# Patient Record
Sex: Female | Born: 1995 | Race: White | Hispanic: No | Marital: Single | Smoking: Never smoker
Health system: Southern US, Community
[De-identification: ages and names within clinical notes are randomized; demographics above are authoritative.]

---

## 2015-10-07 ENCOUNTER — Encounter: Payer: Self-pay | Admitting: Emergency Medicine

## 2015-10-07 ENCOUNTER — Ambulatory Visit
Admission: EM | Admit: 2015-10-07 | Discharge: 2015-10-07 | Disposition: A | Discharge status: Home / Self Care | Payer: Self-pay | Attending: Family Medicine | Admitting: Family Medicine

## 2015-10-07 DIAGNOSIS — H00013 Hordeolum externum right eye, unspecified eyelid: Secondary | ICD-10-CM

## 2015-10-07 DIAGNOSIS — R21 Rash and other nonspecific skin eruption: Secondary | ICD-10-CM

## 2015-10-07 MED ORDER — POLYMYXIN B-TRIMETHOPRIM 10000-0.1 UNIT/ML-% OP SOLN: 1.0000 [drp] | Freq: Four times a day (QID) | OPHTHALMIC | Status: DC

## 2015-10-07 NOTE — ED Provider Notes (Signed)
CSN: [id number]     Arrival date & time 10/07/15  1111 History   First MD Initiated Contact with Patient 10/07/15 1133     Chief Complaint  Patient presents with  . Eye Problem  . Rash   (Consider location/radiation/quality/duration/timing/severity/associated sxs/prior Treatment) HPI: Patient presents today with symptoms of right upper eyelid swelling and redness. Patient states that the symptoms started on Monday. She also has a slightly itchy red rash on her abdomen. She denies any fever or chills. She has taken Benadryl for the rash. She does wear eye makeup but hasn't in the last few weeks. She denies any upper respiratory symptoms or fever. She denies any vision problems. She does not wear contact lenses. She has been waking up with some matting to her eyelash on the right.  History reviewed. No pertinent past medical history. History reviewed. No pertinent past surgical history. History reviewed. No pertinent family history. Social History  Substance Use Topics  . Smoking status: Never Smoker   . Smokeless tobacco: Never Used  . Alcohol Use: Yes   OB History    No data available     Review of Systems: Negative except mentioned above.  Allergies  Bee venom and Sulfa antibiotics  Home Medications   Prior to Admission medications   Medication Sig Start Date End Date Taking? Authorizing Provider  trimethoprim-polymyxin b (POLYTRIM) ophthalmic solution Place 1 drop into the right eye every 6 (six) hours. For 7 days. 10/07/15   Jolene ProvostKirtida Aireonna Bauer, MD   Meds Ordered and Administered this Visit  Medications - No data to display  BP 117/72 mmHg  Pulse 63  Temp(Src) 98 F (36.7 C) (Oral)  Resp 16  Ht 5\' 1"  (1.549 m)  Wt 120 lb (54.432 kg)  BMI 22.69 kg/m2  SpO2 100%  LMP 10/03/2015 No data found.   Physical Exam   GENERAL: NAD HEENT: no pharyngeal erythema, no exudate, no erythema of TMs, PERRL, EOMI, mild erythema and swelling of one area of right upper eyelid, there is a  small whitehead noted, mild tenderness to palpation, there is no other erythema or tenderness around the eye, no erythema of conjunctiva, no cervical LAD RESP: CTA B CARD: RRR SKIN: several small erythematous lesions on abdomen, some oval shaped, a few on back  NEURO: CN II-XII grossly intact   ED Course  Procedures (including critical care time)  Labs Review Labs Reviewed - No data to display  Imaging Review No results found.   Visual Acuity Review  Right Eye Distance: 20/25 uncorrected Left Eye Distance: 20/20 uncorrected Bilateral Distance:         MDM   1. Hordeolum, right   2. Skin rash   1)Discussed with patient to use warm compresses on the eye, will prescribe Polytrim ophthalmic given patient's insurance situation, if symptoms do persist or worsen I do recommend that she follow up with [facility]iments eye. Encourage patient not to wear any eye makeup at this time until symptoms have fully resolved. She should wash her hands frequently.  2)The skin rash looks suspicious for pityriasis. Discussed this with the patient, can use antihistamine if needed for any itchiness. If the symptoms of the rash do persist or worsen I do recommend that she follow-up here with her primary care physician.    Jolene ProvostKirtida My Rinke, MD 10/07/15 1153

## 2015-10-07 NOTE — Discharge Instructions (Signed)
Warm compresses to the eye 2 or 3 times a day, do not use any eye makeup, follow-up with eye doctor if symptoms persist or worsen as discussed.

## 2015-10-07 NOTE — ED Notes (Signed)
Patient c/o redness and swelling in her right upper eye lid on Monday.  Patient c/o itchy rash on her abdomen that started on Monday.

## 2016-04-17 ENCOUNTER — Encounter: Payer: Self-pay | Admitting: Emergency Medicine

## 2016-04-17 ENCOUNTER — Ambulatory Visit
Admission: EM | Admit: 2016-04-17 | Discharge: 2016-04-17 | Disposition: A | Discharge status: Home / Self Care | Payer: BLUE CROSS/BLUE SHIELD | Attending: Family Medicine | Admitting: Family Medicine

## 2016-04-17 ENCOUNTER — Ambulatory Visit (INDEPENDENT_AMBULATORY_CARE_PROVIDER_SITE_OTHER): Payer: BLUE CROSS/BLUE SHIELD

## 2016-04-17 DIAGNOSIS — S96911A Strain of unspecified muscle and tendon at ankle and foot level, right foot, initial encounter: Secondary | ICD-10-CM | POA: Diagnosis not present

## 2016-04-17 NOTE — ED Provider Notes (Signed)
CSN: [id number]     Arrival date & time 04/17/16  1300 History   First MD Initiated Contact with Patient 04/17/16 1400     Chief Complaint  Patient presents with  . Ankle Pain   (Consider location/radiation/quality/duration/timing/severity/associated sxs/prior Treatment) Patient is a healthy 20 year old female, presents today for right ankle pain. Patient stepped off her boyfriend's truck last night and twisted the right ankle on the way down. Patient denies ankle pain immediately after the incident and was able to ambulate. The ankle pain started this morning upon waking up. Patient reports able to ambulate but is limping. Current pain is 5 out of 10.    The history is provided by the patient.    History reviewed. No pertinent past medical history. History reviewed. No pertinent surgical history. No family history on file. Social History  Substance Use Topics  . Smoking status: Never Smoker  . Smokeless tobacco: Never Used  . Alcohol use Yes   OB History    No data available     Review of Systems  All other systems reviewed and are negative.   Allergies  Bee venom and Sulfa antibiotics  Home Medications   Prior to Admission medications   Medication Sig Start Date End Date Taking? Authorizing Provider  trimethoprim-polymyxin b (POLYTRIM) ophthalmic solution Place 1 drop into the right eye every 6 (six) hours. For 7 days. 10/07/15   Jolene ProvostKirtida Patel, MD   Meds Ordered and Administered this Visit  Medications - No data to display  BP 110/63 (BP Location: Left Arm)   Pulse 80   Temp 98.3 F (36.8 C) (Tympanic)   Resp 18   Ht 5\' 1"  (1.549 m)   Wt 130 lb (59 kg)   LMP 04/17/2016 (Exact Date)   SpO2 100%   BMI 24.56 kg/m  No data found.   Physical Exam  Constitutional: She is oriented to person, place, and time. She appears well-developed and well-nourished.  Cardiovascular: Normal rate.   Pulmonary/Chest: Effort normal.  Musculoskeletal:  Right ankle has full  range of motion. Lateral aspect of the ankle is slightly swollen but with no ecchymosis. Tender to palpate over swollen area.  Patient able to bear weight. Has intact sensation.   Neurological: She is alert and oriented to person, place, and time.  Nursing note and vitals reviewed.   Urgent Care Course   Clinical Course     Procedures (including critical care time)  Labs Review Labs Reviewed - No data to display  Imaging Review Dg Ankle Complete Right  Result Date: 04/17/2016 CLINICAL DATA:  Pt states she twisted her right ankle getting out of the truck last night, pain and swelling lateral right ankle. shielded EXAM: RIGHT ANKLE - COMPLETE 3+ VIEW COMPARISON:  None. FINDINGS: Soft tissue swelling noted along the lateral aspect of the ankle. No acute fracture or subluxation. No radiopaque foreign body or soft tissue gas. IMPRESSION: Mild soft tissue swelling. Electronically Signed   By: Norva PavlovElizabeth  Brown M.D.   On: 04/17/2016 14:18     MDM   1. Strain of right ankle, initial encounter    Ankle x-ray negative for fracture. Patient informed of the xray result. RICE therapy discussed.  F/u with PCP if ankle pain does not resolve.     Lucia EstelleFeng Willia Lampert, NP 04/17/16 1426

## 2016-04-17 NOTE — ED Triage Notes (Signed)
Kristen Poole getting out of a truck and injured right ankle on last night

## 2016-09-28 ENCOUNTER — Ambulatory Visit (INDEPENDENT_AMBULATORY_CARE_PROVIDER_SITE_OTHER): Payer: BLUE CROSS/BLUE SHIELD

## 2016-09-28 ENCOUNTER — Ambulatory Visit
Admission: EM | Admit: 2016-09-28 | Discharge: 2016-09-28 | Disposition: A | Discharge status: Home / Self Care | Payer: BLUE CROSS/BLUE SHIELD | Attending: Family Medicine | Admitting: Family Medicine

## 2016-09-28 ENCOUNTER — Encounter: Payer: Self-pay | Admitting: *Deleted

## 2016-09-28 DIAGNOSIS — M79672 Pain in left foot: Secondary | ICD-10-CM | POA: Diagnosis not present

## 2016-09-28 MED ORDER — MELOXICAM 7.5 MG PO TABS: 7.5000 mg | ORAL_TABLET | Freq: Every day | ORAL | 0 refills | Status: DC

## 2016-09-28 NOTE — ED Triage Notes (Signed)
Left foot pain for over a week, lateral side, has been on and off, now worse than before.

## 2016-09-28 NOTE — ED Provider Notes (Signed)
[facility] ____________________________________________  Time seen: Approximately 9:35 AM  I have reviewed the triage vital signs and the nursing notes.   HISTORY  Chief Complaint Foot Pain   HPI Kristen Poole is a 21 y.o. female  presenting for evaluation of left lateral foot pain that is been present for the last week. Patient reports she did have a similar pain that was present today for 1 week and then spontaneously resolved. Reports pain is in present again this past week, intermittent increases in intensity. States pain is mild currently, at times moderate. Reports did originally take some over-the-counter ibuprofen and somebody gave him last few days. Denies any new trauma, injury or recent injury. Denies any pain radiation, paresthesias or other discomfort. Reports otherwise feels well. Denies any rash, stepping on anything. denies break in skin. Reports does stand on her feet with frequent walking at work. Reports otherwise feels well. States has previously had a fracture the fifth metatarsal to this foot as well as a sprain to the ankle early this year. Denies any past surgeries the foot.  Denies chest pain, shortness of breath, abdominal pain, or rash. Denies recent sickness.   Patient's last menstrual period was 08/31/2016 (within days).Denies pregnancy.   History reviewed. No pertinent past medical history. Denies There are no active problems to display for this patient.   History reviewed. No pertinent surgical history.   No current facility-administered medications for this encounter.   Current Outpatient Prescriptions:  none Allergies Bee venom and Sulfa antibiotics  History reviewed. No pertinent family history.  Social History Social History  Substance Use Topics  . Smoking status: Never Smoker  . Smokeless tobacco: Never Used  . Alcohol use Yes    Review of Systems Constitutional: No fever/chills Cardiovascular: Denies chest  pain. Respiratory: Denies shortness of breath. Genitourinary: Negative for dysuria. Musculoskeletal: Negative for back pain. As above Skin: Negative for rash.   ____________________________________________   PHYSICAL EXAM:  VITAL SIGNS: ED Triage Vitals  Enc Vitals Group     BP 09/28/16 0836 100/61     Pulse Rate 09/28/16 0836 85     Resp 09/28/16 0836 17     Temp 09/28/16 0836 98.5 F (36.9 C)     Temp Source 09/28/16 0836 Oral     SpO2 09/28/16 0836 100 %     Weight 09/28/16 0837 125 lb (56.7 kg)     Height 09/28/16 0837 5\' 1"  (1.549 m)     Head Circumference --      Peak Flow --      Pain Score 09/28/16 0837 6     Pain Loc --      Pain Edu? --      Excl. in GC? --     Constitutional: Alert and oriented. Well appearing and in no acute distress. Cardiovascular: Normal rate, regular rhythm. Grossly normal heart sounds.  Good peripheral circulation. Respiratory: Normal respiratory effort without tachypnea nor retractions. Breath sounds are clear and equal bilaterally. No wheezes, rales, rhonchi. Musculoskeletal: No midline cervical, thoracic or lumbar tenderness to palpation. Bilateral pedal pulses equal and easily palpated. Except: Left lateral foot at the base of the fifth metatarsal mild point tenderness to palpation, no swelling, no ecchymosis, no deformity noted left foot and left lower extremity otherwise nontender, left foot with full range of motion present, normal distal sensation and capillary refill. Ambulatory with steady gait. Neurologic:  Normal speech and language.Speech is normal. No gait instability.  Skin:  Skin is warm, dry  Psychiatric: Mood and affect are normal. Speech and behavior are normal. Patient exhibits appropriate insight and judgment   ___________________________________________   LABS (all labs ordered are listed, but only abnormal results are displayed)  Labs Reviewed - No data to display  RADIOLOGY  Dg Foot Complete Left  Result  Date: 09/28/2016 CLINICAL DATA:  Pain at the base of the fifth metatarsal. Remote history injury December 2017. EXAM: LEFT FOOT - COMPLETE 3+ VIEW COMPARISON:  None. FINDINGS: There is no evidence of fracture or dislocation. There is no evidence of arthropathy or other focal bone abnormality. Soft tissues are unremarkable. IMPRESSION: Negative. Electronically Signed   By: Marnee Spring M.D.   On: 09/28/2016 09:25   ____________________________________________   PROCEDURES Procedures     INITIAL IMPRESSION / ASSESSMENT AND PLAN / ED COURSE  Pertinent labs & imaging results that were available during my care of the patient were reviewed by me and considered in my medical decision making (see chart for details).  Well-appearing patient. No acute distress. Left foot x-ray per radiologist negative. Discussed with patient encouraged monitoring shoes for good supportive shoes and avoidance irritation, rest, ice, then Mobic.encouraged supportive care. Work note given for today. Encouraged patient to follow-up with podiatry as needed for continued pain. Discussed indication, risks and benefits of medications with patient.  Discussed follow up with Primary care physician this week. Discussed follow up and return parameters including no resolution or any worsening concerns. Patient verbalized understanding and agreed to plan.   ____________________________________________   FINAL CLINICAL IMPRESSION(S) / ED DIAGNOSES  Final diagnoses:  Left foot pain     Discharge Medication List as of 09/28/2016  9:39 AM    START taking these medications   Details  meloxicam (MOBIC) 7.5 MG tablet Take 1 tablet (7.5 mg total) by mouth daily., Starting Wed 09/28/2016, Normal        Note: This dictation was prepared with Dragon dictation along with smaller phrase technology. Any transcriptional errors that result from this process are unintentional.         Renford Dills, NP 09/28/16 1000

## 2016-09-28 NOTE — Discharge Instructions (Signed)
Take medication as prescribed. Rest. Ice.  Follow up with your primary care physician or podiatry as needed. Return to Urgent care for new or worsening concerns.

## 2016-09-29 ENCOUNTER — Ambulatory Visit (INDEPENDENT_AMBULATORY_CARE_PROVIDER_SITE_OTHER): Payer: BLUE CROSS/BLUE SHIELD

## 2016-09-29 ENCOUNTER — Encounter: Payer: Self-pay | Admitting: *Deleted

## 2016-09-29 ENCOUNTER — Ambulatory Visit
Admission: EM | Admit: 2016-09-29 | Discharge: 2016-09-29 | Disposition: A | Discharge status: Home / Self Care | Payer: BLUE CROSS/BLUE SHIELD | Attending: Emergency Medicine | Admitting: Emergency Medicine

## 2016-09-29 DIAGNOSIS — Z203 Contact with and (suspected) exposure to rabies: Secondary | ICD-10-CM | POA: Diagnosis not present

## 2016-09-29 DIAGNOSIS — S51851A Open bite of right forearm, initial encounter: Secondary | ICD-10-CM

## 2016-09-29 DIAGNOSIS — W540XXA Bitten by dog, initial encounter: Secondary | ICD-10-CM

## 2016-09-29 DIAGNOSIS — Z23 Encounter for immunization: Secondary | ICD-10-CM

## 2016-09-29 MED ORDER — HYDROCODONE-ACETAMINOPHEN 5-325 MG PO TABS: 2.0000 | ORAL_TABLET | Freq: Four times a day (QID) | ORAL | 0 refills | Status: AC | PRN

## 2016-09-29 MED ORDER — TETANUS-DIPHTH-ACELL PERTUSSIS 5-2.5-18.5 LF-MCG/0.5 IM SUSP
0.5000 mL | Freq: Once | INTRAMUSCULAR | Status: AC
  Administered 2016-09-29: 0.5 mL via INTRAMUSCULAR

## 2016-09-29 MED ORDER — IBUPROFEN 600 MG PO TABS: 600.0000 mg | ORAL_TABLET | Freq: Four times a day (QID) | ORAL | 0 refills | Status: AC | PRN

## 2016-09-29 MED ORDER — AMOXICILLIN-POT CLAVULANATE 875-125 MG PO TABS: 1.0000 | ORAL_TABLET | Freq: Two times a day (BID) | ORAL | 0 refills | Status: AC

## 2016-09-29 NOTE — ED Triage Notes (Signed)
Pt bitten by neighbor's dog yesterday evening. Puncture wound to right forearm with edema. Owner states dog up to date on shots. Dog was destroyed last night.

## 2016-09-29 NOTE — ED Provider Notes (Signed)
HPI  SUBJECTIVE:  Kristen Poole is a right-handed 21 y.o. female who presents with a dog bite to her right forearm last night. Patient states that the dog was run over by a car and was stuck underneath the car, and she reached in to help the dog, and the dog bit her. States that this was her neighbor's dog. It passed away last night. All of its immunizations are up-to-date. She reports constant dull, throbbing, pain, swelling in the area. She tried rinsing the wounds with tap water and peroxide. No alleviating factors. Symptoms are worse with using her right hand. She denies foreign body sensation, erythema, joint redness, elbow pain, bodyaches, fevers, numbness or tingling. She reports grip weakness secondary to the pain. Past medical history negative for diabetes, hypertension. Last tetanus unknown. LMP: 5/16. Denies possibility of being pregnant. PMD: None. States that she does not have any allergies to penicillin.  History reviewed. No pertinent past medical history.  History reviewed. No pertinent surgical history.  History reviewed. No pertinent family history.  Social History  Substance Use Topics  . Smoking status: Never Smoker  . Smokeless tobacco: Never Used  . Alcohol use Yes    No current facility-administered medications for this encounter.   Current Outpatient Prescriptions:  .  amoxicillin-clavulanate (AUGMENTIN) 875-125 MG tablet, Take 1 tablet by mouth 2 (two) times daily. X 10 days, Disp: 20 tablet, Rfl: 0 .  HYDROcodone-acetaminophen (NORCO/VICODIN) 5-325 MG tablet, Take 2 tablets by mouth every 6 (six) hours as needed for moderate pain., Disp: 20 tablet, Rfl: 0 .  ibuprofen (ADVIL,MOTRIN) 600 MG tablet, Take 1 tablet (600 mg total) by mouth every 6 (six) hours as needed., Disp: 20 tablet, Rfl: 0  Allergies  Allergen Reactions  . Bee Venom Other (See Comments)  . Sulfa Antibiotics Hives     ROS  As noted in HPI.   Physical Exam  BP 109/65 (BP Location: Left  Arm)   Pulse 82   Temp 98 F (36.7 C) (Oral)   Resp 16   Ht 5\' 1"  (1.549 m)   Wt 125 lb (56.7 kg)   LMP 08/31/2016 (Within Days) Comment: denies preg  SpO2 100%   BMI 23.62 kg/m   Constitutional: Well developed, well nourished, no acute distress Eyes:  EOMI, conjunctiva normal bilaterally HENT: Normocephalic, atraumatic,mucus membranes moist Respiratory: Normal inspiratory effort Cardiovascular: Normal rate GI: nondistended skin: No rash, skin intact Musculoskeletal: no deformities. Single puncture wound right lateral forearm with localized swelling, tenderness. Wound explored with adequate hemostasis, no foreign body  noted. No pain with elbow range of motion, no joint erythema, edema, swelling. Patient motor and sensation intact in the median/radial/ulnar nerve. RP 2+.     Neuro: alert& oriented x 3, no focal neuro deficits Psychiatric: Speech and behavior appropriate   ED Course   Medications  Tdap (BOOSTRIX) injection 0.5 mL (0.5 mLs Intramuscular Given 09/29/16 1107)    Orders Placed This Encounter  Procedures  . DG Forearm Right    Standing Status:   Standing    Number of Occurrences:   1    Order Specific Question:   Reason for Exam (SYMPTOM  OR DIAGNOSIS REQUIRED)    Answer:   dog bite r/o retained FB    No results found for this or any previous visit (from the past 24 hour(s)).  No results found for this or any previous visit. Dg Forearm Right  Result Date: 09/29/2016 CLINICAL DATA:  Dog bite over the right lateral proximal forearm  with puncture wounds visible near the elbow. Soft tissue bruising and swelling. EXAM: RIGHT FOREARM - 2 VIEW COMPARISON:  None in PACs FINDINGS: The right radius and ulna are subjectively adequately mineralized. There is no acute fracture. The joint spaces are well maintained. The soft tissues exhibit no foreign bodies or abnormal gas collections. Deep to the area marked with a pointer there is mild soft tissue swelling but no other  abnormality. IMPRESSION: Mild soft tissue swelling over the proximal forearm at the site of injury. No foreign bodies or underlying bony injuries are observed. Electronically Signed   By: David  SwazilandJordan M.D.   On: 09/29/2016 11:26   ED Clinical Impression  Dog bite, initial encounter   ED Assessment/Plan  No evidence of compartment syndrome. We will obtain x-ray to rule out foreign body or fracture. Will not suture as this is been present for almost 18 hours. Wound washed with chlorhexidine and tap water, dressed with nonstick dressing, bacitracin, Ace wrap. Updating tetanus today. Plan will send home with Augmentin, Norco, ibuprofen/Tylenol. We'll provide a primary care referral.  Imaging independently reviewed. No fracture, foreign body. See radiology report for details.  Plan as above.  Discussed imaging, MDM, plan and followup with patient Discussed sn/sx that should prompt return to the ED. Patient agrees with plan.   Meds ordered this encounter  Medications  . Tdap (BOOSTRIX) injection 0.5 mL  . amoxicillin-clavulanate (AUGMENTIN) 875-125 MG tablet    Sig: Take 1 tablet by mouth 2 (two) times daily. X 10 days    Dispense:  20 tablet    Refill:  0  . HYDROcodone-acetaminophen (NORCO/VICODIN) 5-325 MG tablet    Sig: Take 2 tablets by mouth every 6 (six) hours as needed for moderate pain.    Dispense:  20 tablet    Refill:  0  . ibuprofen (ADVIL,MOTRIN) 600 MG tablet    Sig: Take 1 tablet (600 mg total) by mouth every 6 (six) hours as needed.    Dispense:  20 tablet    Refill:  0    *This clinic note was created using Scientist, clinical (histocompatibility and immunogenetics)Dragon dictation software. Therefore, there may be occasional mistakes despite careful proofreading.  ?   Domenick GongMortenson, Ercelle Winkles, MD 09/29/16 1146

## 2016-09-29 NOTE — Discharge Instructions (Signed)
Make sure you finish all of the Augmentin, even if you feel better. Ice, elevate, wear the Ace wrap as needed for pain and swelling. Take 600 mg of ibuprofen with 1 g of Tylenol 3-4 times a day. Or you may take the ibuprofen with Norco. Do not take Norco and Tylenol in them, as they both have Tylenol in them and too much Tylenol can hurt your liver. Each pill of Norco has 325 mg of Tylenol in it. Do not exceed 4 g of Tylenol from all sources in one day. Or, you can take Mobic once daily instead of the ibuprofen. Do not take both Mobic and the ibuprofen. Go to the ER for pain not controlled with medications, for hand or grip weakness, redness streaking up your arm, elbow pain, swelling, redness, color changes in your hand or fingers.  Here is a list of primary care providers who are taking new patients:  Dr. Elizabeth Sauereanna Jones, Dr. Schuyler AmorWilliam Plonk [address] [place] [address] [phone]  Dr. Everlene OtherJayce Cook [address]  [place] [address]  [phone]  [facility] [place] [address] [place] [address]  [place] [address]  [phone]  [facility] [address]  [place], [place] [address] [phone]  [facility] [place] [address]  [phone] [place], [place] [address]  Go to www.goodrx.com to look up your medications. This will give you a list of where you can find your prescriptions at the most affordable prices. Or ask the pharmacist what the cash price is. This can be less expensive than what you would pay with insurance.

## 2018-12-26 IMAGING — CR DG FOOT COMPLETE 3+V*L*
3 series · 4 of 4 positions shown · non-contrast
Comparison: None.

CLINICAL DATA: Pain at the base of the fifth metatarsal. Remote
history injury March 2016.

EXAM:
LEFT FOOT - COMPLETE 3+ VIEW

[foot ap]
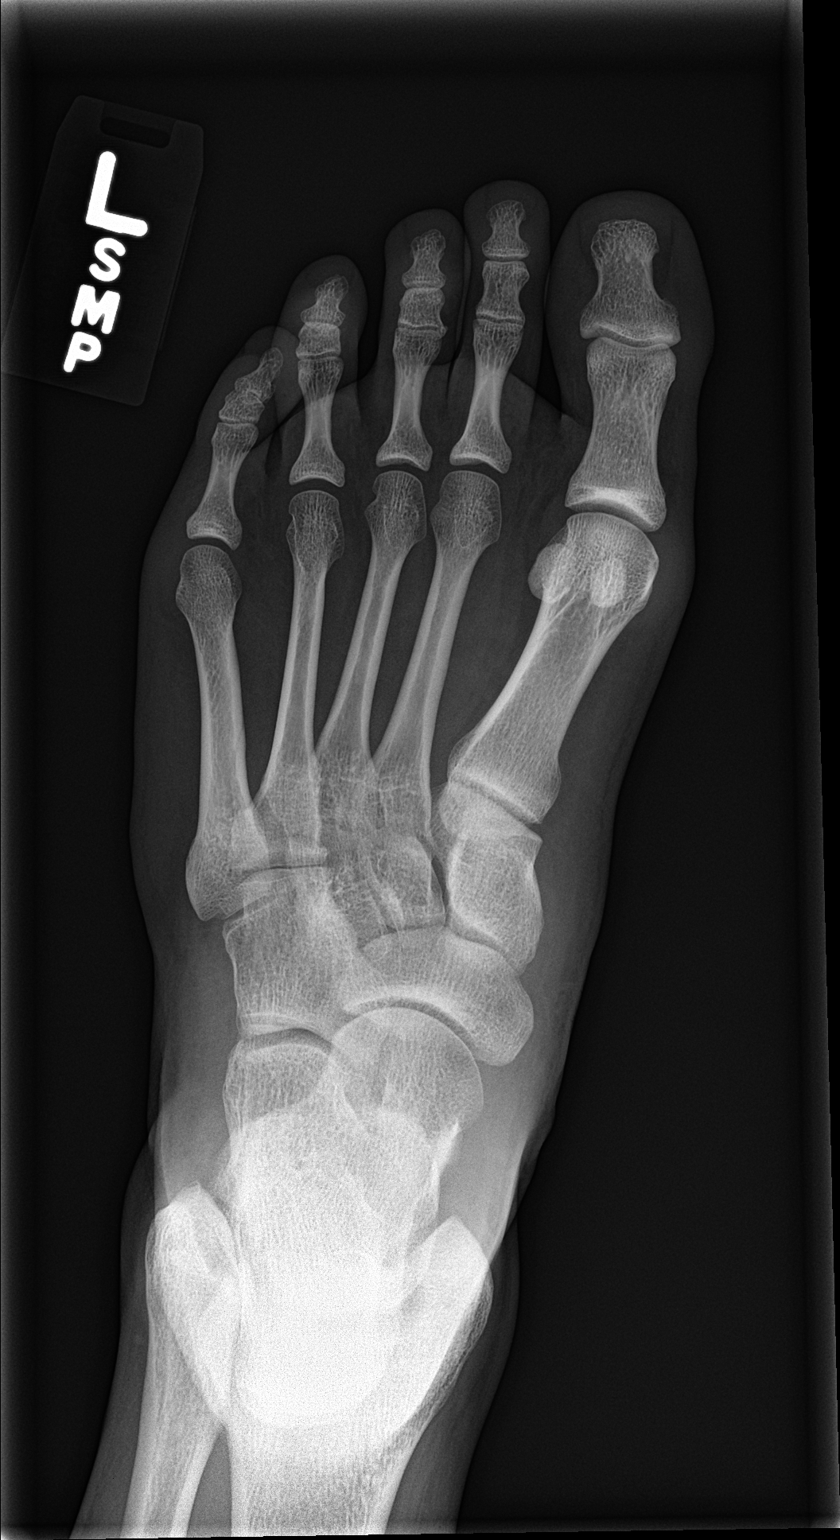

[foot obl]
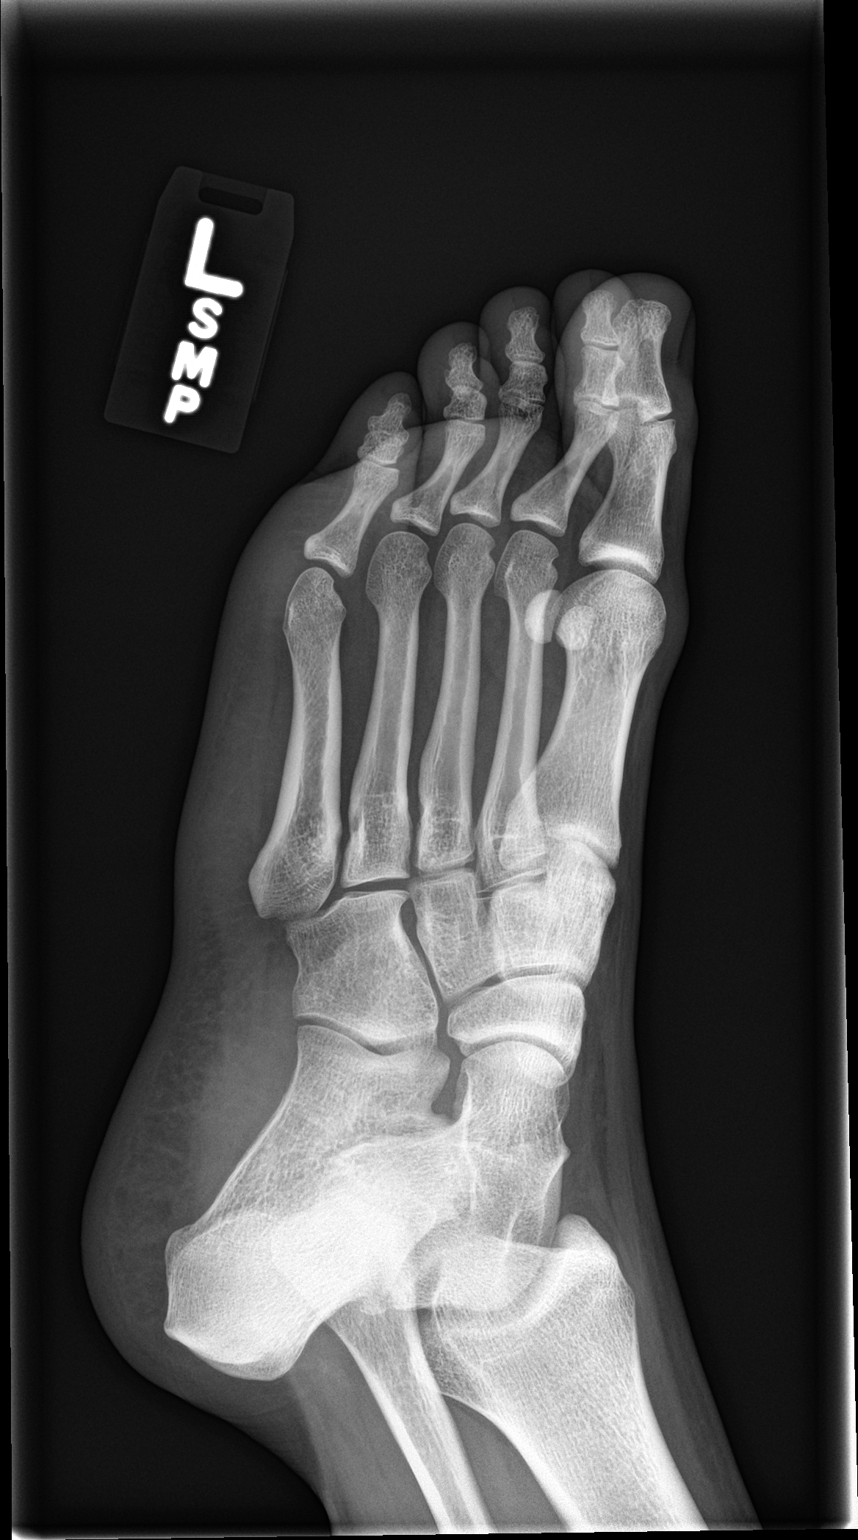

[Series 3: foot lat · 0.14mm/px · 2 of 2 slices shown]
[im 1/2]
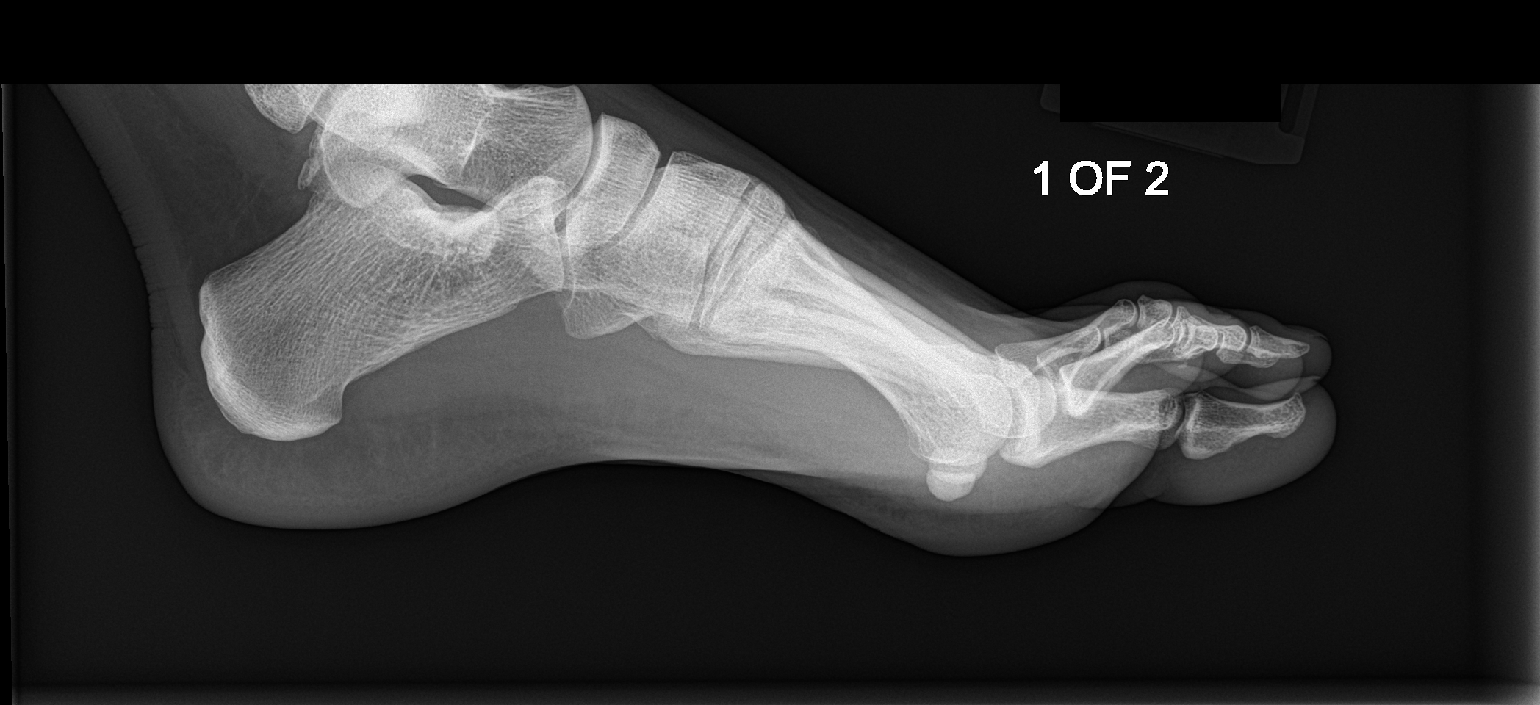
[im 2/2]
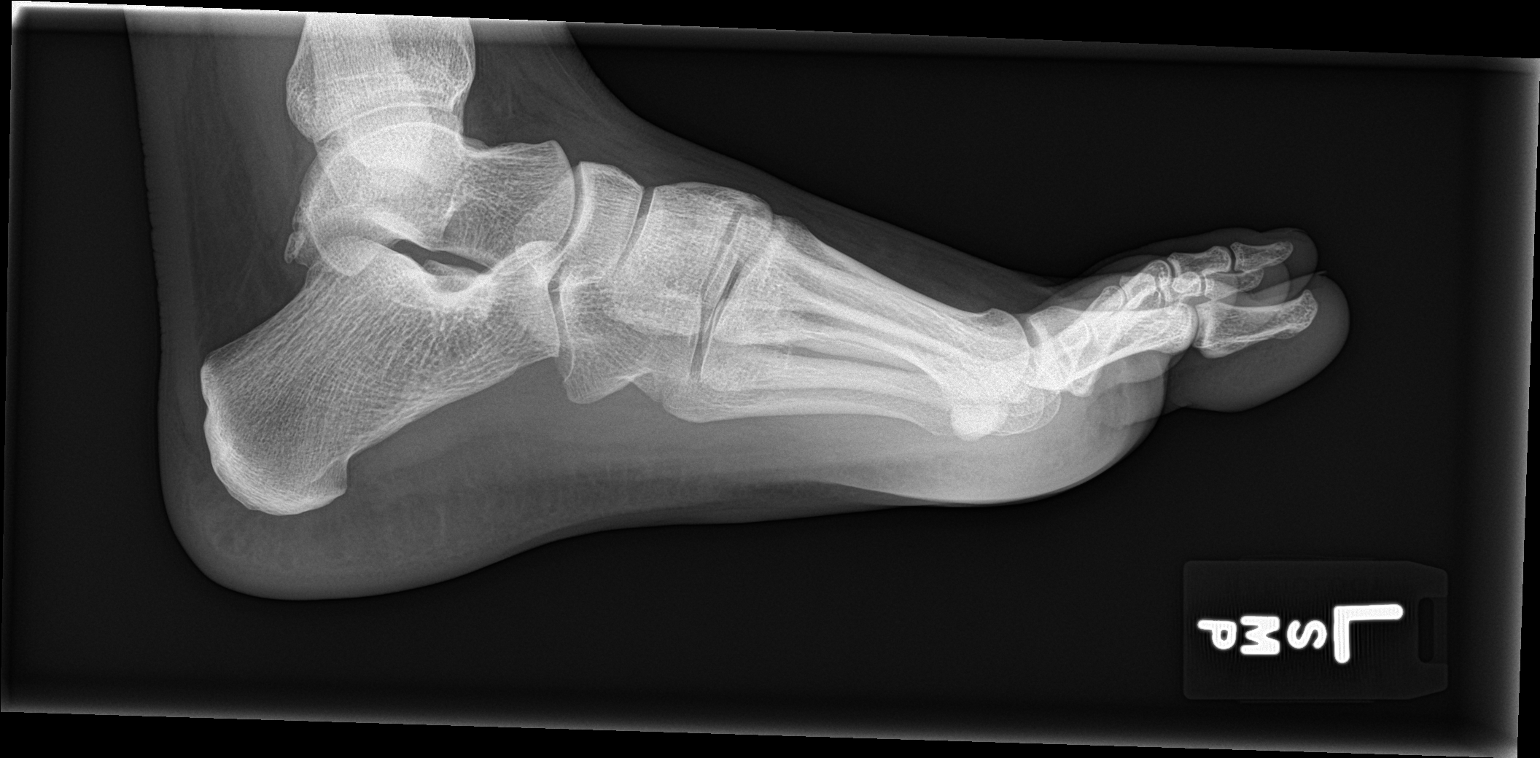

[4 of 4 positions shown; findings below may reference images not displayed]

FINDINGS: There is no evidence of fracture or dislocation. There is no
evidence of arthropathy or other focal bone abnormality. Soft
tissues are unremarkable.
IMPRESSION: Negative.
